# Patient Record
Sex: Male | Born: 1995 | Race: Black or African American | Hispanic: No | Marital: Single | Smoking: Never smoker
Health system: Southern US, Community
[De-identification: ages and names within clinical notes are randomized; demographics above are authoritative.]

---

## 2014-01-28 ENCOUNTER — Emergency Department (HOSPITAL_COMMUNITY): Payer: No Typology Code available for payment source

## 2014-01-28 ENCOUNTER — Emergency Department (HOSPITAL_COMMUNITY)
Admission: EM | Admit: 2014-01-28 | Discharge: 2014-01-28 | Disposition: A | Discharge status: Home / Self Care | Payer: No Typology Code available for payment source | Attending: Emergency Medicine | Admitting: Emergency Medicine

## 2014-01-28 ENCOUNTER — Encounter (HOSPITAL_COMMUNITY): Payer: Self-pay | Admitting: Emergency Medicine

## 2014-01-28 DIAGNOSIS — Y9389 Activity, other specified: Secondary | ICD-10-CM | POA: Insufficient documentation

## 2014-01-28 DIAGNOSIS — S4350XA Sprain of unspecified acromioclavicular joint, initial encounter: Secondary | ICD-10-CM | POA: Insufficient documentation

## 2014-01-28 DIAGNOSIS — Y929 Unspecified place or not applicable: Secondary | ICD-10-CM | POA: Insufficient documentation

## 2014-01-28 DIAGNOSIS — S4352XA Sprain of left acromioclavicular joint, initial encounter: Secondary | ICD-10-CM

## 2014-01-28 DIAGNOSIS — S8000XA Contusion of unspecified knee, initial encounter: Secondary | ICD-10-CM | POA: Insufficient documentation

## 2014-01-28 MED ORDER — NAPROXEN 500 MG PO TABS: 500.0000 mg | ORAL_TABLET | Freq: Two times a day (BID) | ORAL | Status: DC

## 2014-01-28 MED ORDER — KETOROLAC TROMETHAMINE 60 MG/2ML IM SOLN
60.0000 mg | Freq: Once | INTRAMUSCULAR | Status: AC
  Administered 2014-01-28: 60 mg via INTRAMUSCULAR
  Filled 2014-01-28: qty 2

## 2014-01-28 MED ORDER — CYCLOBENZAPRINE HCL 10 MG PO TABS: 10.0000 mg | ORAL_TABLET | Freq: Two times a day (BID) | ORAL | Status: DC | PRN
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
## 2014-01-28 NOTE — Progress Notes (Signed)
Orthopedic Tech Progress Note Patient Details:  Marthe PatchMaleik D Dehoyos 11/17/1995 [id number]  Ortho Devices Type of Ortho Device: Arm sling Ortho Device/Splint Location: LUE Ortho Device/Splint Interventions: Ordered;Application   Jennye MoccasinAnthony Craig Hailynn Slovacek 01/28/2014, 6:31 PM
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
## 2014-02-01 NOTE — ED Provider Notes (Signed)
Medical screening examination/treatment/procedure(s) were performed by non-physician practitioner and as supervising physician I was immediately available for consultation/collaboration.   EKG Interpretation None       Taji Barretto, MD 02/01/14 0920 

## 2014-04-16 ENCOUNTER — Emergency Department (HOSPITAL_COMMUNITY)
Admission: EM | Admit: 2014-04-16 | Discharge: 2014-04-16 | Disposition: A | Discharge status: Home / Self Care | Payer: No Typology Code available for payment source | Attending: Emergency Medicine | Admitting: Emergency Medicine

## 2014-04-16 ENCOUNTER — Emergency Department (HOSPITAL_COMMUNITY): Payer: No Typology Code available for payment source

## 2014-04-16 ENCOUNTER — Encounter (HOSPITAL_COMMUNITY): Payer: Self-pay | Admitting: Emergency Medicine

## 2014-04-16 DIAGNOSIS — N5089 Other specified disorders of the male genital organs: Secondary | ICD-10-CM

## 2014-04-16 DIAGNOSIS — Z791 Long term (current) use of non-steroidal anti-inflammatories (NSAID): Secondary | ICD-10-CM | POA: Insufficient documentation

## 2014-04-16 DIAGNOSIS — N508 Other specified disorders of male genital organs: Secondary | ICD-10-CM | POA: Insufficient documentation

## 2014-04-16 DIAGNOSIS — Z79899 Other long term (current) drug therapy: Secondary | ICD-10-CM | POA: Insufficient documentation

## 2014-04-16 NOTE — ED Provider Notes (Signed)
Medical screening examination/treatment/procedure(s) were performed by non-physician practitioner and as supervising physician I was immediately available for consultation/collaboration.   EKG Interpretation None        Audree CamelScott T Kenyada Dosch, MD 04/16/14 1626

## 2014-04-16 NOTE — ED Notes (Signed)
Pt. Stated, I have a bump on the side of my scrotum , I saw it last night.

## 2014-04-16 NOTE — ED Notes (Signed)
PA at bedside.

## 2014-04-16 NOTE — ED Provider Notes (Signed)
CSN: [id number]     Arrival date & time 04/16/14  1417 History  This chart was scribed for non-physician practitioner, Trixie DredgeEmily Lashara Urey, PA-C,working with Raeford RazorStephen Kohut, MD, by Karle PlumberJennifer Tensley, ED Scribe.  This patient was seen in room TR09C/TR09C and the patient's care was started at 3:00 PM.  Chief Complaint  Patient presents with  . Groin Pain   The history is provided by the patient. No language interpreter was used.   HPI Comments:  Phillip Stout is a 18 y.o. male who presents to the Emergency Department complaining of a "bump" that appeared last night on the side of his scrotum. He states he has not been sexually active in three months but only used protection 50% of the time. He denies pain of the area, testicular pain, fever, chills, nausea, vomiting, abdominal pain, diaphoresis, weight loss, or penile discharge.   History reviewed. No pertinent past medical history. History reviewed. No pertinent past surgical history. No family history on file. History  Substance Use Topics  . Smoking status: Never Smoker   . Smokeless tobacco: Not on file  . Alcohol Use: No    Review of Systems A complete 10 system review of systems was obtained and all systems are negative except as noted in the HPI and PMH.   Allergies  Review of patient's allergies indicates no known allergies.  Home Medications   Prior to Admission medications   Medication Sig Start Date End Date Taking? Authorizing Provider  cyclobenzaprine (FLEXERIL) 10 MG tablet Take 1 tablet (10 mg total) by mouth 2 (two) times daily as needed for muscle spasms. 01/28/14   Tatyana A Kirichenko, PA-C  naproxen (NAPROSYN) 500 MG tablet Take 1 tablet (500 mg total) by mouth 2 (two) times daily. 01/28/14   Tatyana A Kirichenko, PA-C   Triage Vitals: BP 127/51  Pulse 70  Temp(Src) 98.7 F (37.1 C) (Oral)  Resp 16  Ht 5\' 10"  (1.778 m)  Wt 140 lb (63.504 kg)  BMI 20.09 kg/m2  SpO2 100% Physical Exam  Nursing note and vitals  reviewed. Constitutional: He appears well-developed and well-nourished. No distress.  HENT:  Head: Normocephalic and atraumatic.  Neck: Neck supple.  Pulmonary/Chest: Effort normal.  Abdominal: Hernia confirmed negative in the right inguinal area and confirmed negative in the left inguinal area.  Genitourinary: Penis normal. Left testis shows mass (nodular mass within scrotum, no testicular mass). Left testis shows no tenderness. Circumcised.  Nontender nodule within left scrotum not associated with the testicle. No testicular mass.  Lymphadenopathy:       Right: No inguinal adenopathy present.       Left: No inguinal adenopathy present.  Neurological: He is alert.  Skin: He is not diaphoretic.    ED Course  Procedures (including critical care time) DIAGNOSTIC STUDIES: Oxygen Saturation is 100% on RA, normal by my interpretation.   COORDINATION OF CARE: 3:03 PM- Will ultrasound area. Offered to test for STDs but pt declined. Pt verbalizes understanding and agrees to plan.  Medications - No data to display  Labs Review Labs Reviewed - No data to display  Imaging Review No results found.   EKG Interpretation None      MDM   Final diagnoses:  Scrotal mass   Pt with nontender mass at edge of left scrotum, feels like lymph node vs early abscess but is nontender.  US negative.  D/C home with urology follow up.  Pt declined STD/HIV testing.  Discussed result, findings, treatment, and follow up  with patient.  Pt given return precautions.  Pt verbalizes understanding and agrees with plan.      I personally performed the services described in this documentation, which was scribed in my presence. The recorded information has been reviewed and is accurate.    Trixie Dredgemily Tiawanna Luchsinger, PA-C 04/16/14 1610

## 2014-04-16 NOTE — Discharge Instructions (Signed)
Read the information below.  You may return to the Emergency Department at any time for worsening condition or any new symptoms that concern you.  If you develop high fevers, testicular or abdominal pain, uncontrolled vomiting, or are unable to tolerate fluids by mouth, return to the ER for a recheck.    Scrotal Masses Scrotal swelling is common in men of all ages. Common types of testicular masses include:   Hydrocele. The most common benign testicular mass in an adult. Hydroceles are generally soft and painless collections of fluid in the scrotal sac. These can rapidly change size as the fluid enters or leaves. Hydroceles can be associated with an underlying cancer of the testicle.  Spermatoceles. Generally soft and painless cyst-like masses in the scrotum that contain fluid, usually above the testicle. They can rapidly change size as the fluid enters or leaves. They are more prominent while standing or exercising. Sometimes, spermatoceles may cause a sensation of heaviness or a dull ache.  Orchitis. Inflammation of the testicle. It is painful and may be associated with a fever or symptoms of a urinary tract infection, including frequent and painful urination. It is common in males who have the mumps.  Varicocele. An enlargement of the veins that drain the testicles. Varicoceles usually occur on the left side of the scrotum. This condition can increase the risk of infertility. Varicocele is sometimes more prominent while standing or exercising. Sometimes, varicoceles may cause a sensation of heaviness or a dull ache.  Inguinal hernia. A bulge caused by a portion of intestine protruding into the scrotum through a weak area in the abdominal muscles. Hernias may or may not be painful. They are soft and usually enlarge with coughing or straining.  Torsion of the testis. This can cause a testicular mass that develops quickly and is associated with tenderness or fever, or both. It is caused by a twisting  of the testicle within the sac. It also reduces the blood supply and can destroy the testis if not treated quickly with surgery.  Epididymitis. Inflammation of the epididymis (a structure attached above and behind the testicle), usually caused by a urinary tract infection or a sexually transmitted infection. This generally shows up as testicular discomfort and swelling and may include pain during urination. It is frequently associated with a testicle infection.  Testicular appendages. Remnants of tissue on the testis present since birth. A testicular appendage can twist on its blood supply and cause pain. In most cases, this is seen as a blue dot on the scrotum.  Hematocele. A collection of blood between the layers of the sac inside the scrotum. It usually is caused by trauma to the scrotum.  Sebaceous cysts. These can be a swelling in the skin of the scrotum and are usually painless.  Cancer (carcinoma) of the skin of the scrotum. It can cause scrotal swelling, but this is rare. Document Released: 04/07/2003 Document Revised: 06/03/2013 Document Reviewed: 03/23/2013 [facility] Patient Information 2015 [place], [place]. This information is not intended to replace advice given to you by your health care provider. Make sure you discuss any questions you have with your health care provider.

## 2015-07-07 ENCOUNTER — Encounter (HOSPITAL_COMMUNITY): Payer: Self-pay | Admitting: Emergency Medicine

## 2015-07-07 ENCOUNTER — Emergency Department (HOSPITAL_COMMUNITY): Payer: Self-pay

## 2015-07-07 ENCOUNTER — Emergency Department (HOSPITAL_COMMUNITY)
Admission: EM | Admit: 2015-07-07 | Discharge: 2015-07-07 | Disposition: A | Discharge status: Home / Self Care | Payer: Self-pay | Attending: Emergency Medicine | Admitting: Emergency Medicine

## 2015-07-07 DIAGNOSIS — S199XXA Unspecified injury of neck, initial encounter: Secondary | ICD-10-CM | POA: Insufficient documentation

## 2015-07-07 DIAGNOSIS — S0990XA Unspecified injury of head, initial encounter: Secondary | ICD-10-CM | POA: Insufficient documentation

## 2015-07-07 DIAGNOSIS — Y9389 Activity, other specified: Secondary | ICD-10-CM | POA: Insufficient documentation

## 2015-07-07 DIAGNOSIS — S4992XA Unspecified injury of left shoulder and upper arm, initial encounter: Secondary | ICD-10-CM | POA: Insufficient documentation

## 2015-07-07 DIAGNOSIS — Y998 Other external cause status: Secondary | ICD-10-CM | POA: Insufficient documentation

## 2015-07-07 DIAGNOSIS — M542 Cervicalgia: Secondary | ICD-10-CM

## 2015-07-07 DIAGNOSIS — Y92410 Unspecified street and highway as the place of occurrence of the external cause: Secondary | ICD-10-CM | POA: Insufficient documentation

## 2015-07-07 DIAGNOSIS — S4991XA Unspecified injury of right shoulder and upper arm, initial encounter: Secondary | ICD-10-CM | POA: Insufficient documentation

## 2015-07-07 DIAGNOSIS — S3992XA Unspecified injury of lower back, initial encounter: Secondary | ICD-10-CM | POA: Insufficient documentation

## 2015-07-07 MED ORDER — METHOCARBAMOL 500 MG PO TABS: 500.0000 mg | ORAL_TABLET | Freq: Two times a day (BID) | ORAL | Status: DC | PRN

## 2015-07-07 MED ORDER — TRAMADOL HCL 50 MG PO TABS
50.0000 mg | ORAL_TABLET | Freq: Once | ORAL | Status: AC
  Administered 2015-07-07: 50 mg via ORAL
  Filled 2015-07-07: qty 1

## 2015-07-07 MED ORDER — TRAMADOL HCL 50 MG PO TABS: 50.0000 mg | ORAL_TABLET | Freq: Four times a day (QID) | ORAL | Status: DC | PRN

## 2015-07-07 MED ORDER — IBUPROFEN 800 MG PO TABS
800.0000 mg | ORAL_TABLET | Freq: Three times a day (TID) | ORAL | Status: DC
Start: 2015-07-07 — End: 2022-05-08
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
## 2015-07-07 NOTE — ED Notes (Signed)
Awake. Verbally responsive. A/O x4. Resp even and unlabored. No audible adventitious breath sounds noted. ABC's intact. Pt watching TV. Family at bedside. 

## 2015-07-07 NOTE — Discharge Instructions (Signed)

## 2015-07-07 NOTE — ED Notes (Signed)
Pt given d/c instructions and school note. Pt verbalized understanding.
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
## 2015-07-07 NOTE — ED Notes (Signed)
Awake. Verbally responsive. A/O x4. Resp even and unlabored. No audible adventitious breath sounds noted. ABC's intact. Family at bedside. 

## 2017-12-15 ENCOUNTER — Emergency Department (HOSPITAL_COMMUNITY)
Admission: EM | Admit: 2017-12-15 | Discharge: 2017-12-15 | Disposition: A | Discharge status: Home / Self Care | Payer: No Typology Code available for payment source | Attending: Emergency Medicine | Admitting: Emergency Medicine

## 2017-12-15 ENCOUNTER — Other Ambulatory Visit: Payer: Self-pay

## 2017-12-15 ENCOUNTER — Encounter (HOSPITAL_COMMUNITY): Payer: Self-pay | Admitting: Oncology

## 2017-12-15 ENCOUNTER — Emergency Department (HOSPITAL_COMMUNITY): Payer: No Typology Code available for payment source

## 2017-12-15 DIAGNOSIS — S8981XA Other specified injuries of right lower leg, initial encounter: Secondary | ICD-10-CM | POA: Diagnosis present

## 2017-12-15 DIAGNOSIS — S161XXA Strain of muscle, fascia and tendon at neck level, initial encounter: Secondary | ICD-10-CM | POA: Insufficient documentation

## 2017-12-15 DIAGNOSIS — Y92410 Unspecified street and highway as the place of occurrence of the external cause: Secondary | ICD-10-CM | POA: Diagnosis not present

## 2017-12-15 DIAGNOSIS — Y9389 Activity, other specified: Secondary | ICD-10-CM | POA: Insufficient documentation

## 2017-12-15 DIAGNOSIS — Y999 Unspecified external cause status: Secondary | ICD-10-CM | POA: Insufficient documentation

## 2017-12-15 DIAGNOSIS — S1980XA Other specified injuries of unspecified part of neck, initial encounter: Secondary | ICD-10-CM | POA: Insufficient documentation

## 2017-12-15 LAB — CBC WITH DIFFERENTIAL/PLATELET
BASOS ABS: 0 10*3/uL (ref 0.0–0.1)
Basophils Relative: 0 %
Eosinophils Absolute: 0.2 10*3/uL (ref 0.0–0.7)
Eosinophils Relative: 4 %
HEMATOCRIT: 44.9 % (ref 39.0–52.0)
Hemoglobin: 15.3 g/dL (ref 13.0–17.0)
Lymphocytes Relative: 40 %
Lymphs Abs: 2.4 10*3/uL (ref 0.7–4.0)
MCH: 30.7 pg (ref 26.0–34.0)
MCHC: 34.1 g/dL (ref 30.0–36.0)
MCV: 90.2 fL (ref 78.0–100.0)
MONO ABS: 0.5 10*3/uL (ref 0.1–1.0)
MONOS PCT: 8 %
NEUTROS ABS: 3 10*3/uL (ref 1.7–7.7)
Neutrophils Relative %: 48 %
PLATELETS: 244 10*3/uL (ref 150–400)
RBC: 4.98 MIL/uL (ref 4.22–5.81)
RDW: 12.9 % (ref 11.5–15.5)
WBC: 6.1 10*3/uL (ref 4.0–10.5)

## 2017-12-15 LAB — COMPREHENSIVE METABOLIC PANEL
ALBUMIN: 4 g/dL (ref 3.5–5.0)
ALT: 14 U/L — ABNORMAL LOW (ref 17–63)
AST: 20 U/L (ref 15–41)
Alkaline Phosphatase: 73 U/L (ref 38–126)
Anion gap: 9 (ref 5–15)
BILIRUBIN TOTAL: 0.5 mg/dL (ref 0.3–1.2)
BUN: 11 mg/dL (ref 6–20)
CO2: 25 mmol/L (ref 22–32)
Calcium: 9.3 mg/dL (ref 8.9–10.3)
Chloride: 104 mmol/L (ref 101–111)
Creatinine, Ser: 1.02 mg/dL (ref 0.61–1.24)
GFR calc Af Amer: >60 mL/min (ref >60)
GFR calc non Af Amer: >60 mL/min (ref >60)
Glucose, Bld: 103 mg/dL — ABNORMAL HIGH (ref 65–99)
POTASSIUM: 4.2 mmol/L (ref 3.5–5.1)
SODIUM: 138 mmol/L (ref 135–145)
TOTAL PROTEIN: 6.8 g/dL (ref 6.5–8.1)

## 2017-12-15 LAB — PROTIME-INR
INR: 1.05
Prothrombin Time: 13.7 seconds (ref 11.4–15.2)

## 2017-12-15 MED ORDER — CYCLOBENZAPRINE HCL 10 MG PO TABS: 10.0000 mg | ORAL_TABLET | Freq: Two times a day (BID) | ORAL | 0 refills | Status: AC | PRN

## 2017-12-15 MED ORDER — IBUPROFEN 600 MG PO TABS: 600.0000 mg | ORAL_TABLET | Freq: Three times a day (TID) | ORAL | 0 refills | Status: AC | PRN

## 2017-12-15 MED ORDER — HYDROCODONE-ACETAMINOPHEN 5-325 MG PO TABS
1.0000 | ORAL_TABLET | Freq: Once | ORAL | Status: AC
  Administered 2017-12-15: 1 via ORAL
  Filled 2017-12-15: qty 1

## 2017-12-15 MED ORDER — IBUPROFEN 400 MG PO TABS
600.0000 mg | ORAL_TABLET | Freq: Once | ORAL | Status: AC
  Administered 2017-12-15: 600 mg via ORAL
  Filled 2017-12-15: qty 1

## 2017-12-15 NOTE — ED Notes (Signed)
Pt in radiology at this time. 
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
## 2022-05-08 ENCOUNTER — Ambulatory Visit (HOSPITAL_COMMUNITY)
Admission: EM | Admit: 2022-05-08 | Discharge: 2022-05-08 | Disposition: A | Discharge status: Home / Self Care | Payer: Self-pay | Attending: Urgent Care | Admitting: Urgent Care

## 2022-05-08 ENCOUNTER — Encounter (HOSPITAL_COMMUNITY): Payer: Self-pay

## 2022-05-08 DIAGNOSIS — N3000 Acute cystitis without hematuria: Secondary | ICD-10-CM

## 2022-05-08 LAB — POCT URINALYSIS DIPSTICK, ED / UC
Bilirubin Urine: NEGATIVE
Glucose, UA: NEGATIVE mg/dL
Ketones, ur: NEGATIVE mg/dL
Nitrite: POSITIVE — AB
Protein, ur: NEGATIVE mg/dL
Specific Gravity, Urine: 1.025 (ref 1.005–1.030)
Urobilinogen, UA: 2 mg/dL — ABNORMAL HIGH (ref 0.0–1.0)
pH: 6.5 (ref 5.0–8.0)

## 2022-05-08 MED ORDER — CIPROFLOXACIN HCL 500 MG PO TABS: 500.0000 mg | ORAL_TABLET | Freq: Two times a day (BID) | ORAL | 0 refills | Status: AC

## 2022-05-08 NOTE — ED Provider Notes (Signed)
[facility]    CSN: [id number] Arrival date & time: 05/08/22  0810      History   Chief Complaint Chief Complaint  Patient presents with   Dysuria    HPI Phillip Stout is a 26 y.o. male.   Pleasant 26 year old male presents today with 3-day history of dysuria.  He states pain when he urinates only.  No prior history of UTI.  Denies any pain in office, only with urination.  Denies any flank pain or fever.  Has been taking Azo with some relief to symptoms.  Denies any rash, testicular pain, swelling.  Denies any exposure to STI.  Denies any discharge.  No visible hematuria.  Patient with no past medical history.   Dysuria Presenting symptoms: dysuria   Presenting symptoms: no penile discharge and no penile pain   Associated symptoms: no flank pain, no hematuria, no penile swelling and no scrotal swelling     History reviewed. No pertinent past medical history.  There are no problems to display for this patient.   History reviewed. No pertinent surgical history.     Home Medications    Prior to Admission medications   Medication Sig Start Date End Date Taking? Authorizing Provider  ciprofloxacin (CIPRO) 500 MG tablet Take 1 tablet (500 mg total) by mouth every 12 (twelve) hours for 5 days. 05/08/22 05/13/22 Yes Leslee Haueter L, PA    Family History Family History  Problem Relation Age of Onset   Cancer Mother     Social History Social History   Tobacco Use   Smoking status: Never   Smokeless tobacco: Never  Substance Use Topics   Alcohol use: No   Drug use: No     Allergies   Patient has no known allergies.   Review of Systems Review of Systems  Genitourinary:  Positive for dysuria. Negative for flank pain, genital sores, hematuria, penile discharge, penile pain, penile swelling, scrotal swelling and testicular pain.  All other systems reviewed and are negative.    Physical Exam Triage Vital Signs ED Triage Vitals  Enc Vitals Group      BP 05/08/22 0908 117/71     Pulse Rate 05/08/22 0908 71     Resp 05/08/22 0908 16     Temp 05/08/22 0908 98 F (36.7 C)     Temp Source 05/08/22 0908 Oral     SpO2 05/08/22 0908 98 %     Weight --      Height --      Head Circumference --      Peak Flow --      Pain Score 05/08/22 0909 8     Pain Loc --      Pain Edu? --      Excl. in GC? --    No data found.  Updated Vital Signs BP 117/71 (BP Location: Left Arm)   Pulse 71   Temp 98 F (36.7 C) (Oral)   Resp 16   SpO2 98%   Visual Acuity Right Eye Distance:   Left Eye Distance:   Bilateral Distance:    Right Eye Near:   Left Eye Near:    Bilateral Near:     Physical Exam Vitals and nursing note reviewed.  Constitutional:      General: He is not in acute distress.    Appearance: Normal appearance. He is well-developed and normal weight. He is not ill-appearing or diaphoretic.  HENT:     Head: Normocephalic and atraumatic.  Eyes:     Conjunctiva/sclera: Conjunctivae normal.  Cardiovascular:     Rate and Rhythm: Normal rate and regular rhythm.     Pulses: Normal pulses.  Pulmonary:     Effort: Pulmonary effort is normal. No respiratory distress.     Breath sounds: Normal breath sounds.  Abdominal:     General: Abdomen is flat. Bowel sounds are normal. There is no distension.     Palpations: Abdomen is soft. There is no mass.     Tenderness: There is no abdominal tenderness. There is no right CVA tenderness, left CVA tenderness, guarding or rebound.     Hernia: No hernia is present.  Genitourinary:    Comments: deferred Musculoskeletal:        General: No swelling.     Cervical back: Neck supple.  Skin:    General: Skin is warm and dry.     Capillary Refill: Capillary refill takes less than 2 seconds.     Findings: No erythema or rash.  Neurological:     General: No focal deficit present.     Mental Status: He is alert.  Psychiatric:        Mood and Affect: Mood normal.      UC Treatments /  Results  Labs (all labs ordered are listed, but only abnormal results are displayed) Labs Reviewed  POCT URINALYSIS DIPSTICK, ED / UC - Abnormal; Notable for the following components:      Result Value   Hgb urine dipstick TRACE (*)    Urobilinogen, UA 2.0 (*)    Nitrite POSITIVE (*)    Leukocytes,Ua SMALL (*)    All other components within normal limits    EKG   Radiology No results found.  Procedures Procedures (including critical care time)  Medications Ordered in UC Medications - No data to display  Initial Impression / Assessment and Plan / UC Course  I have reviewed the triage vital signs and the nursing notes.  Pertinent labs & imaging results that were available during my care of the patient were reviewed by me and considered in my medical decision making (see chart for details).     UTI - sx consistent with acute cystitis. Pt denies sx consistent with STI and defers urethral swab today. Will do empiric tx for UTI with cipro BID x 5 days. Increase water intake, avoid excessive sun.  Should symptoms persist or new symptoms develop, return to clinic for complete work-up.   Final Clinical Impressions(s) / UC Diagnoses   Final diagnoses:  Acute cystitis without hematuria     Discharge Instructions      Your symptoms are consistent with a urinary tract infection. Start taking the antibiotic twice daily until completed. Drink plenty of water. Avoid excessive sunlight while taking the antibiotic. If you develop any pain in your heel while on the antibiotic, stop the medication. Monitor for any change in or worsening symptoms; fever, hematuria or flank pain would warrant a recheck. Please schedule a follow up with your PCP.    ED Prescriptions     Medication Sig Dispense Auth. Provider   ciprofloxacin (CIPRO) 500 MG tablet Take 1 tablet (500 mg total) by mouth every 12 (twelve) hours for 5 days. 10 tablet Girlie Veltri L, [place]      PDMP not reviewed this  encounter.   Maretta Bees, [place] 05/08/22 1047

## 2022-05-08 NOTE — Discharge Instructions (Addendum)
Your symptoms are consistent with a urinary tract infection. Start taking the antibiotic twice daily until completed. Drink plenty of water. Avoid excessive sunlight while taking the antibiotic. If you develop any pain in your heel while on the antibiotic, stop the medication. Monitor for any change in or worsening symptoms; fever, hematuria or flank pain would warrant a recheck. Please schedule a follow up with your PCP.

## 2022-05-08 NOTE — ED Triage Notes (Signed)
Pt states burning with urination for the past 3 days.  States he has been taking AZO at home with no relief.
